# Patient Record
Sex: Male | Born: 1955 | Race: Asian | Hispanic: No | Marital: Married | Smoking: Never smoker
Health system: Southern US, Community
[De-identification: ages and names within clinical notes are randomized; demographics above are authoritative.]

## PROBLEM LIST (undated history)

## (undated) DIAGNOSIS — E119 Type 2 diabetes mellitus without complications: Secondary | ICD-10-CM

## (undated) DIAGNOSIS — E785 Hyperlipidemia, unspecified: Secondary | ICD-10-CM

## (undated) HISTORY — DX: Type 2 diabetes mellitus without complications: E11.9

## (undated) HISTORY — DX: Hyperlipidemia, unspecified: E78.5

---

## 1999-06-14 ENCOUNTER — Emergency Department (HOSPITAL_COMMUNITY): Admission: EM | Admit: 1999-06-14 | Discharge: 1999-06-14 | Payer: Self-pay

## 2009-10-04 ENCOUNTER — Ambulatory Visit (HOSPITAL_COMMUNITY): Admission: RE | Admit: 2009-10-04 | Discharge: 2009-10-04 | Payer: Self-pay | Admitting: Orthopedic Surgery

## 2009-10-29 ENCOUNTER — Encounter: Admission: RE | Admit: 2009-10-29 | Discharge: 2009-11-19 | Payer: Self-pay | Admitting: Neurosurgery

## 2012-12-26 ENCOUNTER — Encounter: Payer: BC Managed Care – PPO | Attending: Family Medicine | Admitting: *Deleted

## 2012-12-26 VITALS — Ht 69.0 in | Wt 185.4 lb

## 2012-12-26 DIAGNOSIS — Z713 Dietary counseling and surveillance: Secondary | ICD-10-CM | POA: Insufficient documentation

## 2012-12-26 DIAGNOSIS — E119 Type 2 diabetes mellitus without complications: Secondary | ICD-10-CM | POA: Insufficient documentation

## 2012-12-26 DIAGNOSIS — IMO0001 Reserved for inherently not codable concepts without codable children: Secondary | ICD-10-CM

## 2012-12-29 ENCOUNTER — Encounter: Payer: Self-pay | Admitting: *Deleted

## 2012-12-29 NOTE — Progress Notes (Signed)
Patient was seen on 12/26/2012 for the first of a series of three diabetes self-management courses at the [facility].   Current HbA1c: 8.9%  The following learning objectives were met by the patient during this course:   Defines the role of glucose and insulin  Identifies type of diabetes and pathophysiology  Defines the diagnostic criteria for diabetes and prediabetes  States the risk factors for Type 2 Diabetes  States the symptoms of Type 2 Diabetes  Defines Type 2 Diabetes treatment goals  Defines Type 2 Diabetes treatment options  States the rationale for glucose monitoring  Identifies A1C, glucose targets, and testing times  Identifies proper sharps disposal  Defines the purpose of a diabetes food plan  Identifies carbohydrate food groups  Defines effects of carbohydrate foods on glucose levels  Identifies carbohydrate choices/grams/food labels  States benefits of physical activity and effect on glucose  Review of suggested activity guidelines  Handouts given during class include:  Type 2 Diabetes: Basics Book  My Food Plan Book  Food and Activity Log  Your patient has identified their diabetes self-care support plan as:  [facility] support group  Continued diabetes classes  Follow-Up Plan: Attend core 2 and core 3   

## 2013-01-02 ENCOUNTER — Encounter: Payer: BC Managed Care – PPO | Attending: Family Medicine

## 2013-01-02 DIAGNOSIS — E119 Type 2 diabetes mellitus without complications: Secondary | ICD-10-CM | POA: Insufficient documentation

## 2013-01-02 DIAGNOSIS — Z713 Dietary counseling and surveillance: Secondary | ICD-10-CM | POA: Insufficient documentation

## 2013-01-03 NOTE — Progress Notes (Signed)
Patient was seen on 01/02/13 for the second of a series of three diabetes self-management courses at the [facility]. The following learning objectives were met by the patient during this course:   Explain basic nutrition maintenance and quality assurance  Describe causes, symptoms and treatment of hypoglycemia and hyperglycemia  Explain how to manage diabetes during illness  Describe the importance of good nutrition for health and healthy eating strategies  List strategies to follow meal plan when dining out  Describe the effects of alcohol on glucose and how to use it safely  Describe problem solving skills for day-to-day glucose challenges  Describe strategies to use when treatment plan needs to change  Identify important factors involved in successful weight loss  Describe ways to remain physically active  Describe the impact of regular activity on insulin resistance  Identify current diabetes medications, their action on blood glucose, and [pssible side effects.  Handouts given in class:  Refrigerator magnet for Sick Day Guidelines  [facility] Oral medication/insulin handout  Your patient has identified their diabetes self-care support plan as:  [facility] support group   Follow-Up Plan: Patient will attend the final class of the ADA Diabetes Self-Care Education.   

## 2013-02-13 ENCOUNTER — Encounter: Payer: BC Managed Care – PPO | Attending: Family Medicine

## 2013-02-13 DIAGNOSIS — Z713 Dietary counseling and surveillance: Secondary | ICD-10-CM | POA: Insufficient documentation

## 2013-02-13 DIAGNOSIS — E119 Type 2 diabetes mellitus without complications: Secondary | ICD-10-CM | POA: Insufficient documentation

## 2013-02-14 NOTE — Progress Notes (Signed)
  Patient was seen on 02/13/13 for the third of a series of three diabetes self-management courses at the [facility]. The following learning objectives were met by the patient during this course:    Describe how diabetes changes over time   Identify diabetes complications and ways to prevent them   Describe strategies that can promote heart health including lowering blood pressure and cholesterol   Describe strategies to lower dietary fat and sodium in the diet   Identify physical activities that benefit cardiovascular health   Evaluate success in meeting personal goal   Describe the belief that they can live successfully with diabetes day to day   Establish 2-3 goals that they will plan to diligently work on until they return for the 64-month follow-up visit  The following handouts were given in class:  Goal setting handout  Class evaluation form  Your patient has established the following 4 month goals for diabetes self-care:  Count carbohydrates at most meals and snacks  Increase activity to at least 3 days a week  Test glucose at least 1 time a day , 4 days a week  To help manage my stress, I will walk at least 3 times a week  Follow-Up Plan: Patient will attend a 4 month follow-up visit for diabetes self-management education.

## 2013-06-05 ENCOUNTER — Ambulatory Visit: Payer: BC Managed Care – PPO | Admitting: *Deleted

## 2017-12-11 ENCOUNTER — Encounter (HOSPITAL_COMMUNITY): Payer: Self-pay | Admitting: Emergency Medicine

## 2017-12-11 ENCOUNTER — Emergency Department (HOSPITAL_COMMUNITY)
Admission: EM | Admit: 2017-12-11 | Discharge: 2017-12-12 | Disposition: A | Discharge status: Home / Self Care | Payer: BLUE CROSS/BLUE SHIELD | Attending: Emergency Medicine | Admitting: Emergency Medicine

## 2017-12-11 ENCOUNTER — Other Ambulatory Visit: Payer: Self-pay

## 2017-12-11 ENCOUNTER — Emergency Department (HOSPITAL_COMMUNITY): Payer: BLUE CROSS/BLUE SHIELD

## 2017-12-11 DIAGNOSIS — I10 Essential (primary) hypertension: Secondary | ICD-10-CM | POA: Insufficient documentation

## 2017-12-11 DIAGNOSIS — Y939 Activity, unspecified: Secondary | ICD-10-CM | POA: Insufficient documentation

## 2017-12-11 DIAGNOSIS — R0789 Other chest pain: Secondary | ICD-10-CM | POA: Diagnosis not present

## 2017-12-11 DIAGNOSIS — E119 Type 2 diabetes mellitus without complications: Secondary | ICD-10-CM | POA: Diagnosis not present

## 2017-12-11 DIAGNOSIS — Y999 Unspecified external cause status: Secondary | ICD-10-CM | POA: Insufficient documentation

## 2017-12-11 DIAGNOSIS — T148XXA Other injury of unspecified body region, initial encounter: Secondary | ICD-10-CM | POA: Diagnosis not present

## 2017-12-11 DIAGNOSIS — Y929 Unspecified place or not applicable: Secondary | ICD-10-CM | POA: Insufficient documentation

## 2017-12-11 DIAGNOSIS — X58XXXA Exposure to other specified factors, initial encounter: Secondary | ICD-10-CM | POA: Diagnosis not present

## 2017-12-11 DIAGNOSIS — R079 Chest pain, unspecified: Secondary | ICD-10-CM | POA: Diagnosis present

## 2017-12-11 LAB — CBC
HCT: 43.3 % (ref 39.0–52.0)
Hemoglobin: 14.1 g/dL (ref 13.0–17.0)
MCH: 28.4 pg (ref 26.0–34.0)
MCHC: 32.6 g/dL (ref 30.0–36.0)
MCV: 87.3 fL (ref 78.0–100.0)
PLATELETS: 215 10*3/uL (ref 150–400)
RBC: 4.96 MIL/uL (ref 4.22–5.81)
RDW: 13 % (ref 11.5–15.5)
WBC: 9.5 10*3/uL (ref 4.0–10.5)

## 2017-12-11 LAB — BASIC METABOLIC PANEL
Anion gap: 10 (ref 5–15)
BUN: 14 mg/dL (ref 6–20)
CALCIUM: 9 mg/dL (ref 8.9–10.3)
CHLORIDE: 104 mmol/L (ref 98–111)
CO2: 23 mmol/L (ref 22–32)
CREATININE: 0.84 mg/dL (ref 0.61–1.24)
GFR calc non Af Amer: >60 mL/min (ref >60)
Glucose, Bld: 147 mg/dL — ABNORMAL HIGH (ref 70–99)
Potassium: 3.5 mmol/L (ref 3.5–5.1)
SODIUM: 137 mmol/L (ref 135–145)

## 2017-12-11 LAB — I-STAT TROPONIN, ED: TROPONIN I, POC: 0 ng/mL (ref 0.00–0.08)

## 2017-12-11 NOTE — ED Triage Notes (Signed)
Pt presents with CP that is L sided, no radiation with intermittent episodes of lightheadedness and hot/cold chills; pt denies cardiac hx; no sob or n/v

## 2017-12-12 LAB — I-STAT TROPONIN, ED: Troponin i, poc: 0 ng/mL (ref 0.00–0.08)

## 2017-12-12 NOTE — Discharge Instructions (Addendum)
You may use over-the-counter Motrin (Ibuprofen), Acetaminophen (Tylenol), topical muscle creams such as SalonPas, Icy Hot, Bengay, etc. Please stretch, apply heat, and have massage therapy for additional assistance. ° °

## 2017-12-12 NOTE — ED Notes (Signed)
Pt understood dc material. NAD noted. 

## 2017-12-12 NOTE — ED Provider Notes (Signed)
[facility] EMERGENCY DEPARTMENT Provider Note  CSN: [id number] Arrival date & time: 12/11/17 2145  Chief Complaint(s) Chest Pain  HPI Dennis Buckley is a 62 y.o. male   The history is provided by the patient.  Chest Pain   This is a new problem. The problem occurs constantly. Progression since onset: fluctuating. The pain is associated with movement. Pain location: upper left chest. The pain is moderate. The quality of the pain is described as burning and sharp. The pain does not radiate. Associated symptoms include back pain (bilateral upper parascapular muscles) and dizziness. Pertinent negatives include no fever, no irregular heartbeat, no leg pain, no lower extremity edema, no nausea and no shortness of breath.  His past medical history is significant for diabetes and hypertension.  Pertinent negatives for past medical history include no CAD, no COPD, no CHF, no MI, no PE, no strokes and no TIA.    Past Medical History Past Medical History:  Diagnosis Date  . Diabetes mellitus without complication (HCC)   . Hyperlipidemia    There are no active problems to display for this patient.  Home Medication(s) Prior to Admission medications   Not on File                                                                                                                                    Past Surgical History History reviewed. No pertinent surgical history. Family History History reviewed. No pertinent family history.  Social History Social History   Tobacco Use  . Smoking status: Never Smoker  Substance Use Topics  . Alcohol use: No  . Drug use: Not on file   Allergies Patient has no known allergies.  Review of Systems Review of Systems  Constitutional: Negative for fever.  Respiratory: Negative for shortness of breath.   Cardiovascular: Positive for chest pain.  Gastrointestinal: Negative for nausea.  Musculoskeletal: Positive for back pain  (bilateral upper parascapular muscles).  Neurological: Positive for dizziness.   All other systems are reviewed and are negative for acute change except as noted in the HPI  Physical Exam Vital Signs  I have reviewed the triage vital signs BP (!) 143/89   Pulse 82   Temp 99 F (37.2 C) (Oral)   Resp 14   Ht 5\' 7"  (1.702 m)   Wt 89.4 kg (197 lb)   SpO2 100%   BMI 30.85 kg/m   Physical Exam  Constitutional: He is oriented to person, place, and time. He appears well-developed and well-nourished. No distress.  HENT:  Head: Normocephalic and atraumatic.  Nose: Nose normal.  Eyes: Pupils are equal, round, and reactive to light. Conjunctivae and EOM are normal. Right eye exhibits no discharge. Left eye exhibits no discharge. No scleral icterus.  Neck: Normal range of motion. Neck supple.  Cardiovascular: Normal rate and regular rhythm. Exam reveals no gallop and no friction rub.  No murmur heard. Pulmonary/Chest:  Effort normal and breath sounds normal. No stridor. No respiratory distress. He has no rales. He exhibits tenderness.    Abdominal: Soft. He exhibits no distension. There is no tenderness.  Musculoskeletal: He exhibits no edema.       Cervical back: He exhibits tenderness and spasm.       Back:  Neurological: He is alert and oriented to person, place, and time.  Skin: Skin is warm and dry. No rash noted. He is not diaphoretic. No erythema.  Psychiatric: He has a normal mood and affect.  Vitals reviewed.   ED Results and Treatments Labs (all labs ordered are listed, but only abnormal results are displayed) Labs Reviewed  BASIC METABOLIC PANEL - Abnormal; Notable for the following components:      Result Value   Glucose, Bld 147 (*)    All other components within normal limits  CBC  I-STAT TROPONIN, ED  I-STAT TROPONIN, ED                                                                                                                         EKG  EKG  Interpretation  Date/Time:  Sunday December 11 2017 21:48:48 EDT Ventricular Rate:  81 PR Interval:  146 QRS Duration: 92 QT Interval:  366 QTC Calculation: 425 R Axis:   52 Text Interpretation:  Normal sinus rhythm Normal ECG NO STEMI No old tracing to compare Confirmed by Drema Pry [phone]) on 12/12/2017 2:03:09 AM      Radiology Dg Chest 2 View  Result Date: 12/11/2017 CLINICAL DATA:  Chest pain on the left. EXAM: CHEST - 2 VIEW COMPARISON:  Report from 06/14/1999 FINDINGS: The heart size and mediastinal contours are within normal limits. Minimal left basilar atelectasis. No pulmonary consolidations or CHF. No effusion or pneumothorax. Mild degenerative change along the thoracic spine. No acute osseous abnormality. IMPRESSION: No active cardiopulmonary disease. Electronically Signed   By: Tollie Eth M.D.   On: 12/11/2017 22:43   Pertinent labs & imaging results that were available during my care of the patient were reviewed by me and considered in my medical decision making (see chart for details).  Medications Ordered in ED Medications - No data to display                                                                                                                                  Procedures Procedures  (  including critical care time)  Medical Decision Making / ED Course I have reviewed the nursing notes for this encounter and the patient's prior records (if available in EHR or on provided paperwork).    Atypical chest pain most consistent with muscular spasm/strain.  Highly inconsistent with ACS.  EKG without acute ischemic changes or evidence of pericarditis.  Initial troponin drawn at triage was negative.  Given his past medical history of hypertension and diabetes, delta troponin was obtained and negative.  I feel this is sufficient to rule out ACS.  Low suspicion for pulmonary embolism.  Presentation not classic  for aortic dissection or esophageal perforation. Chest x-ray  without evidence suggestive of pneumonia, pneumothorax, pneumomediastinum.  No abnormal contour of the mediastinum to suggest dissection. No evidence of acute injuries.  Patient states that he is already scheduled for stress test this week that was scheduled by his primary care provider.  The patient appears reasonably screened and/or stabilized for discharge and I doubt any other medical condition or other [facility] requiring further screening, evaluation, or treatment in the ED at this time prior to discharge.  The patient is safe for discharge with strict return precautions.   Final Clinical Impression(s) / ED Diagnoses Final diagnoses:  Chest wall pain  Muscle strain   Disposition: Discharge  Condition: Good  I have discussed the results, Dx and Tx plan with the patient who expressed understanding and agree(s) with the plan. Discharge instructions discussed at great length. The patient was given strict return precautions who verbalized understanding of the instructions. No further questions at time of discharge.    ED Discharge Orders    None       Follow Up: Primary care provider   As scheduled for stress testing      This chart was dictated using voice recognition software.  Despite best efforts to proofread,  errors can occur which can change the documentation meaning.   Nira Connardama, Zephyr Ridley Eduardo, MD 12/12/17 [phone]

## 2017-12-13 ENCOUNTER — Other Ambulatory Visit: Payer: Self-pay | Admitting: Internal Medicine

## 2017-12-13 DIAGNOSIS — R079 Chest pain, unspecified: Secondary | ICD-10-CM

## 2017-12-15 ENCOUNTER — Ambulatory Visit: Payer: BLUE CROSS/BLUE SHIELD

## 2017-12-15 DIAGNOSIS — R079 Chest pain, unspecified: Secondary | ICD-10-CM | POA: Diagnosis not present

## 2017-12-15 LAB — EXERCISE TOLERANCE TEST
CHL CUP MPHR: 160 {beats}/min
CHL CUP RESTING HR STRESS: 80 {beats}/min
CSEPEDS: 0 s
CSEPEW: 8.5 METS
CSEPPHR: 155 {beats}/min
Exercise duration (min): 7 min
Percent HR: 96 %
RPE: 16

## 2019-12-04 IMAGING — DX DG CHEST 2V
2 series · 2 of 2 positions shown · non-contrast
Comparison: Report from 06/14/1999

CLINICAL DATA: Chest pain on the left.

EXAM:
CHEST - 2 VIEW

[w chest pa]
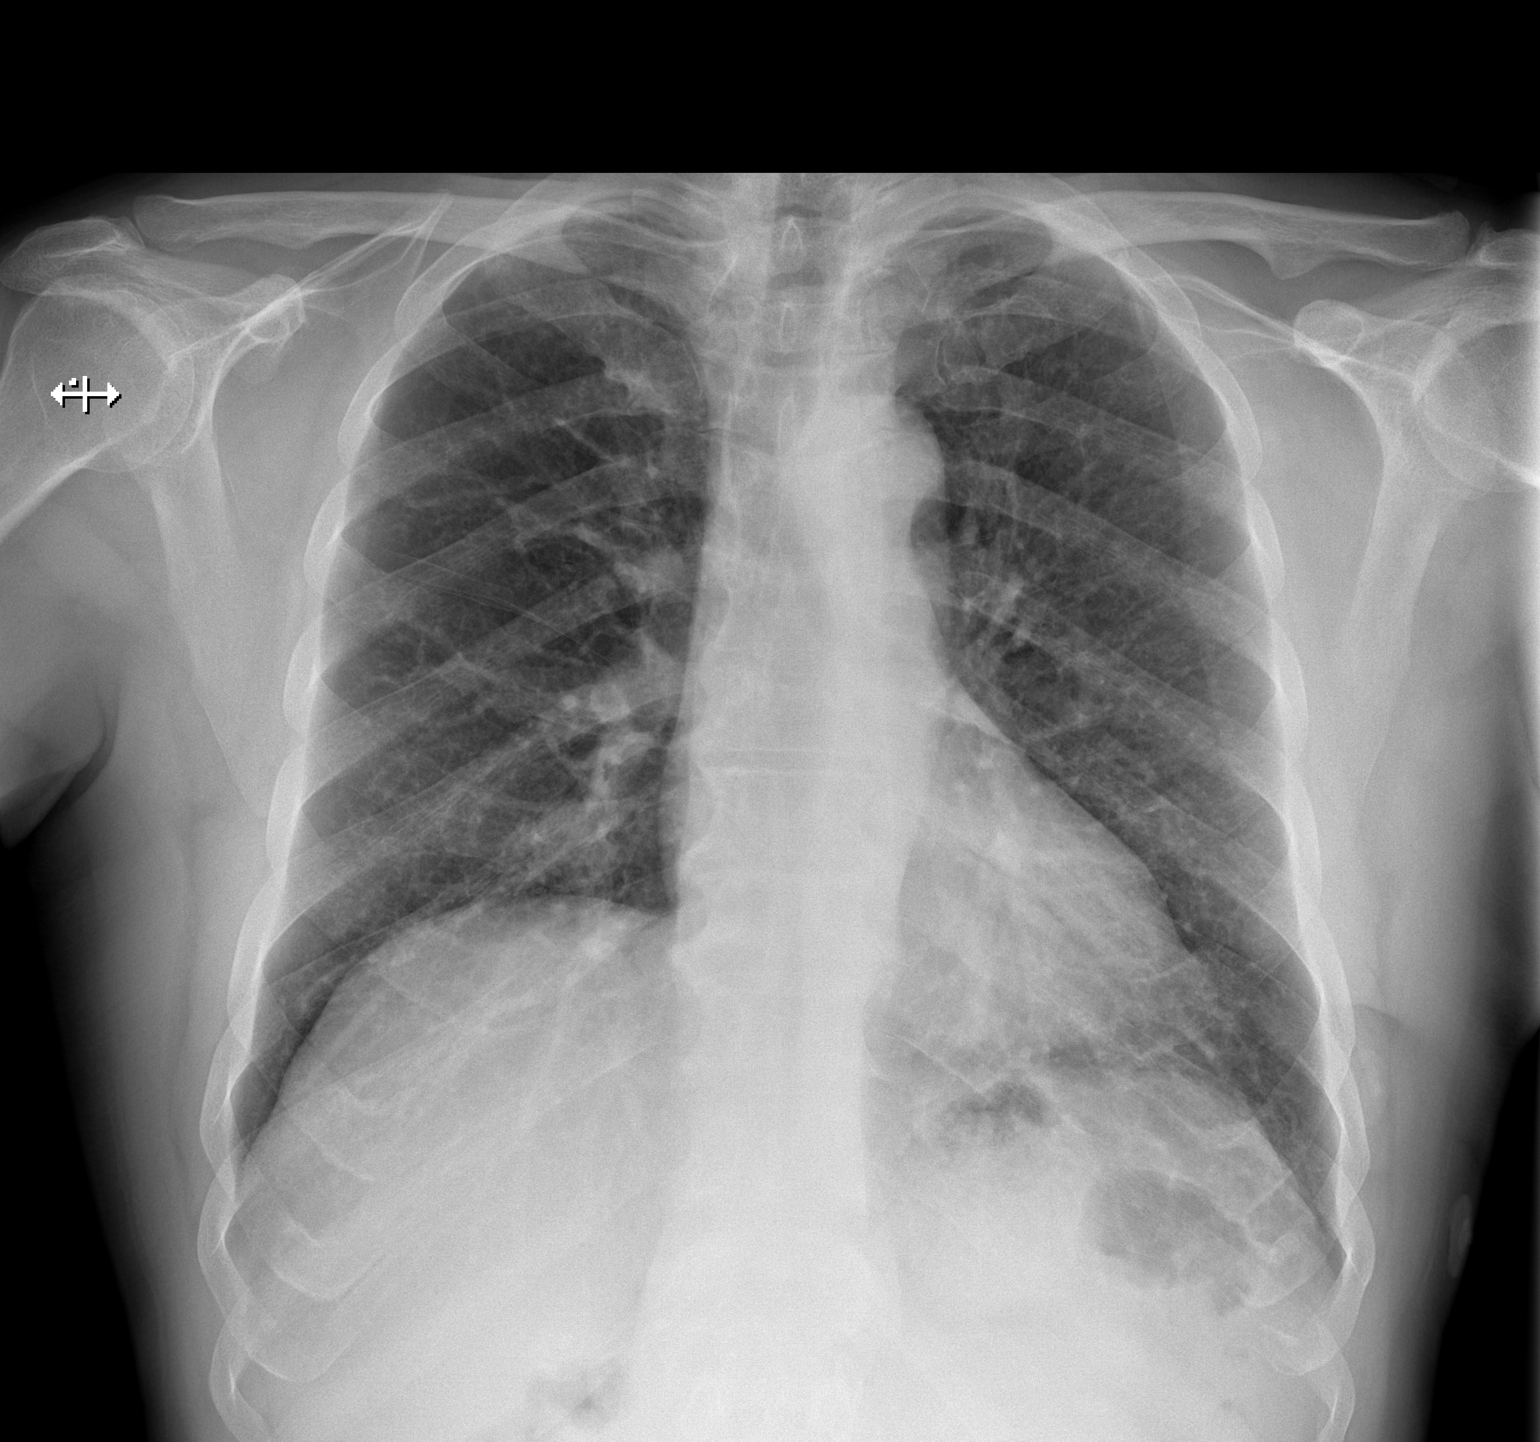

[w chest lat]
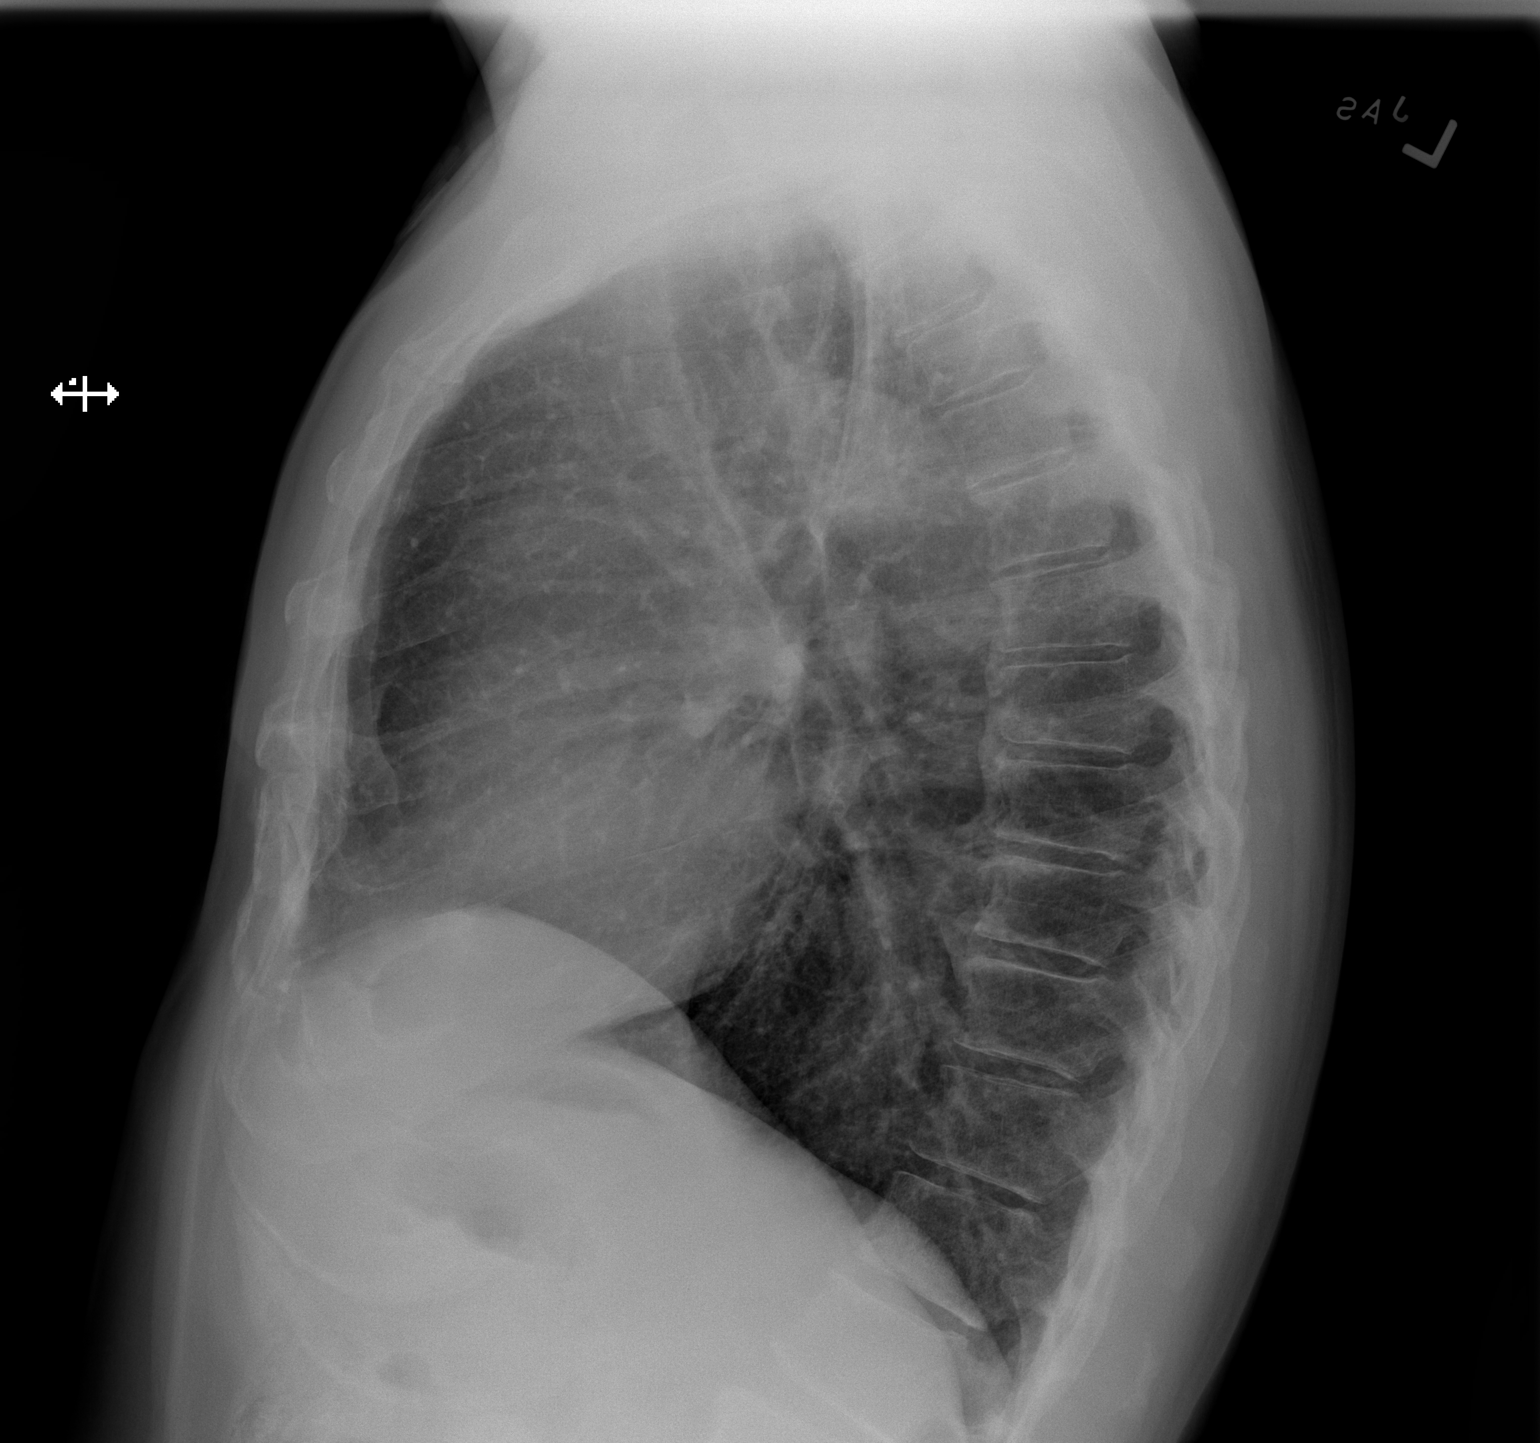

[2 of 2 positions shown; findings below may reference images not displayed]

FINDINGS: The heart size and mediastinal contours are within normal limits.
Minimal left basilar atelectasis. No pulmonary consolidations or
CHF. No effusion or pneumothorax. Mild degenerative change along the
thoracic spine. No acute osseous abnormality.
IMPRESSION: No active cardiopulmonary disease.
